# Patient Record
Sex: Male | Born: 1945 | Race: Black or African American | Hispanic: No | Smoking: Former smoker
Health system: Southern US, Community
[De-identification: ages and names within clinical notes are randomized; demographics above are authoritative.]

## PROBLEM LIST (undated history)

## (undated) DIAGNOSIS — I10 Essential (primary) hypertension: Secondary | ICD-10-CM

---

## 2016-10-01 ENCOUNTER — Other Ambulatory Visit (HOSPITAL_COMMUNITY): Payer: Self-pay | Admitting: Urology

## 2016-10-01 ENCOUNTER — Other Ambulatory Visit: Payer: Self-pay | Admitting: Urology

## 2016-10-01 DIAGNOSIS — D49511 Neoplasm of unspecified behavior of right kidney: Secondary | ICD-10-CM

## 2016-10-07 ENCOUNTER — Ambulatory Visit (HOSPITAL_COMMUNITY)
Admission: RE | Admit: 2016-10-07 | Discharge: 2016-10-07 | Disposition: A | Discharge status: Home / Self Care | Payer: Non-veteran care | Source: Ambulatory Visit | Attending: Urology | Admitting: Urology

## 2016-10-07 DIAGNOSIS — N2889 Other specified disorders of kidney and ureter: Secondary | ICD-10-CM | POA: Diagnosis not present

## 2016-10-07 DIAGNOSIS — D49511 Neoplasm of unspecified behavior of right kidney: Secondary | ICD-10-CM | POA: Insufficient documentation

## 2016-10-07 DIAGNOSIS — K7689 Other specified diseases of liver: Secondary | ICD-10-CM | POA: Diagnosis not present

## 2016-10-07 LAB — POCT I-STAT CREATININE: Creatinine, Ser: 0.9 mg/dL (ref 0.61–1.24)

## 2016-10-07 MED ORDER — GADOBENATE DIMEGLUMINE 529 MG/ML IV SOLN
15.0000 mL | Freq: Once | INTRAVENOUS | Status: AC | PRN
  Administered 2016-10-07: 15 mL via INTRAVENOUS

## 2016-11-01 NOTE — Patient Instructions (Addendum)
Derek Haynes  11/01/2016   Your procedure is scheduled on: 11-05-16  Report to [place]  elevators to 3rd floor to [place] at [place] AM.     Call this number if you have problems the morning of surgery [phone]    Remember: ONLY 1 PERSON MAY GO WITH YOU TO SHORT STAY TO GET  READY MORNING OF [place].  Do not eat food or drink liquids :After Midnight.        Take these medicines the morning of surgery with A SIP OF WATER: None                                You may not have any metal on your body including hair pins and              piercings  Do not wear jewelry, make-up, lotions, powders or perfumes, deodorant             Do not wear nail polish.  Do not shave  48 hours prior to surgery.              Men may shave face and neck.   Do not bring valuables to the hospital. [place].  Contacts, dentures or bridgework may not be worn into surgery.  Leave suitcase in the car. After surgery it may be brought to your room.                  Please read over the following fact sheets you were given: Follow bowel prep instruction as provided by your physician _____________________________________________________________________             [facility] - Preparing for Surgery Before surgery, you can play an important role.  Because skin is not sterile, your skin needs to be as free of germs as possible.  You can reduce the number of germs on your skin by washing with CHG (chlorahexidine gluconate) soap before surgery.  CHG is an antiseptic cleaner which kills germs and bonds with the skin to continue killing germs even after washing. Please DO NOT use if you have an allergy to CHG or antibacterial soaps.  If your skin becomes reddened/irritated stop using the CHG and inform your nurse when you arrive at Short Stay. Do not shave (including legs and underarms) for at least 48  hours prior to the first CHG shower.  You may shave your face/neck. Please follow these instructions carefully:  1.  Shower with CHG Soap the night before surgery and the  morning of Surgery.  2.  If you choose to wash your hair, wash your hair first as usual with your  normal  shampoo.  3.  After you shampoo, rinse your hair and body thoroughly to remove the  shampoo.                           4.  Use CHG as you would any other liquid soap.  You can apply chg directly  to the skin and wash                       Gently  with a scrungie or clean washcloth.  5.  Apply the CHG Soap to your body ONLY FROM THE NECK DOWN.   Do not use on face/ open                           Wound or open sores. Avoid contact with eyes, ears mouth and genitals (private parts).                       Wash face,  Genitals (private parts) with your normal soap.             6.  Wash thoroughly, paying special attention to the area where your surgery  will be performed.  7.  Thoroughly rinse your body with warm water from the neck down.  8.  DO NOT shower/wash with your normal soap after using and rinsing off  the CHG Soap.                9.  Pat yourself dry with a clean towel.            10.  Wear clean pajamas.            11.  Place clean sheets on your bed the night of your first shower and do not  sleep with pets. Day of Surgery : Do not apply any lotions/deodorants the morning of surgery.  Please wear clean clothes to the hospital/surgery center.  FAILURE TO FOLLOW THESE INSTRUCTIONS MAY RESULT IN THE CANCELLATION OF YOUR SURGERY PATIENT SIGNATURE_________________________________  NURSE SIGNATURE__________________________________  ________________________________________________________________________

## 2016-11-02 ENCOUNTER — Encounter (HOSPITAL_COMMUNITY): Payer: Self-pay

## 2016-11-02 ENCOUNTER — Encounter (HOSPITAL_COMMUNITY)
Admission: RE | Admit: 2016-11-02 | Discharge: 2016-11-02 | Disposition: A | Discharge status: Home / Self Care | Payer: Non-veteran care | Source: Ambulatory Visit | Attending: Urology | Admitting: Urology

## 2016-11-02 DIAGNOSIS — N2889 Other specified disorders of kidney and ureter: Secondary | ICD-10-CM | POA: Insufficient documentation

## 2016-11-02 DIAGNOSIS — Z01818 Encounter for other preprocedural examination: Secondary | ICD-10-CM

## 2016-11-02 HISTORY — DX: Essential (primary) hypertension: I10

## 2016-11-02 LAB — COMPREHENSIVE METABOLIC PANEL
ALK PHOS: 63 U/L (ref 38–126)
ALT: 13 U/L — ABNORMAL LOW (ref 17–63)
AST: 17 U/L (ref 15–41)
Albumin: 4.1 g/dL (ref 3.5–5.0)
Anion gap: 5 (ref 5–15)
BILIRUBIN TOTAL: 0.4 mg/dL (ref 0.3–1.2)
BUN: 12 mg/dL (ref 6–20)
CALCIUM: 9.2 mg/dL (ref 8.9–10.3)
CO2: 27 mmol/L (ref 22–32)
Chloride: 109 mmol/L (ref 101–111)
Creatinine, Ser: 0.97 mg/dL (ref 0.61–1.24)
GFR calc Af Amer: >60 mL/min (ref >60)
GFR calc non Af Amer: >60 mL/min (ref >60)
GLUCOSE: 101 mg/dL — AB (ref 65–99)
Potassium: 4.4 mmol/L (ref 3.5–5.1)
SODIUM: 141 mmol/L (ref 135–145)
TOTAL PROTEIN: 8.1 g/dL (ref 6.5–8.1)

## 2016-11-02 LAB — CBC
HEMATOCRIT: 38.6 % — AB (ref 39.0–52.0)
HEMOGLOBIN: 13 g/dL (ref 13.0–17.0)
MCH: 25 pg — ABNORMAL LOW (ref 26.0–34.0)
MCHC: 33.7 g/dL (ref 30.0–36.0)
MCV: 74.2 fL — ABNORMAL LOW (ref 78.0–100.0)
Platelets: 165 10*3/uL (ref 150–400)
RBC: 5.2 MIL/uL (ref 4.22–5.81)
RDW: 15.8 % — ABNORMAL HIGH (ref 11.5–15.5)
WBC: 6.4 10*3/uL (ref 4.0–10.5)

## 2016-11-02 LAB — ABO/RH: ABO/RH(D): B POS

## 2016-11-02 NOTE — Progress Notes (Signed)
Pt provided written copy of bowel prep instructions, but indicated that he did not have a prescription for the medications that was needed. Pt indicated that the prescription needed to be called into the [place].  Noted that date on envelope was stamped 10-04-16 from [place], and procedure scheduled for 11-05-16.. Advised pt to contact [facility] regarding his prescription. Pt indicated that he would go to the office and speak with someone.

## 2016-11-03 LAB — URINE CULTURE: CULTURE: NO GROWTH

## 2016-11-04 NOTE — Anesthesia Preprocedure Evaluation (Addendum)
Anesthesia Evaluation  Patient identified by MRN, date of birth, ID band Patient awake    Reviewed: Allergy & Precautions, NPO status , Patient's Chart, lab work & pertinent test results  Airway Mallampati: II  TM Distance: >3 FB Neck ROM: Full    Dental  (+) Missing, Poor Dentition   Pulmonary former smoker,    Pulmonary exam normal breath sounds clear to auscultation       Cardiovascular hypertension, Pt. on medications Normal cardiovascular exam Rhythm:Regular Rate:Normal     Neuro/Psych negative neurological ROS  negative psych ROS   GI/Hepatic negative GI ROS, Neg liver ROS,   Endo/Other  negative endocrine ROS  Renal/GU negative Renal ROS     Musculoskeletal negative musculoskeletal ROS (+)   Abdominal   Peds  Hematology negative hematology ROS (+)   Anesthesia Other Findings   Reproductive/Obstetrics                            Anesthesia Physical Anesthesia Plan  ASA: II  Anesthesia Plan: General   Post-op Pain Management:    Induction: Intravenous  Airway Management Planned: Oral ETT  Additional Equipment:   Intra-op Plan:   Post-operative Plan: Extubation in OR  Informed Consent: I have reviewed the patients History and Physical, chart, labs and discussed the procedure including the risks, benefits and alternatives for the proposed anesthesia with the patient or authorized representative who has indicated his/her understanding and acceptance.   Dental advisory given  Plan Discussed with: CRNA  Anesthesia Plan Comments:         Anesthesia Quick Evaluation

## 2016-11-05 ENCOUNTER — Inpatient Hospital Stay (HOSPITAL_COMMUNITY): Payer: Non-veteran care | Admitting: Anesthesiology

## 2016-11-05 ENCOUNTER — Inpatient Hospital Stay (HOSPITAL_COMMUNITY)
Admission: RE | Admit: 2016-11-05 | Discharge: 2016-11-06 | DRG: 658 | Disposition: A | Discharge status: Home / Self Care | Payer: Non-veteran care | Source: Ambulatory Visit | Attending: Urology | Admitting: Urology

## 2016-11-05 ENCOUNTER — Encounter (HOSPITAL_COMMUNITY): Payer: Self-pay | Admitting: *Deleted

## 2016-11-05 ENCOUNTER — Encounter (HOSPITAL_COMMUNITY)
Admission: RE | Disposition: A | Discharge status: Home / Self Care | Payer: Self-pay | Source: Ambulatory Visit | Attending: Urology

## 2016-11-05 DIAGNOSIS — C641 Malignant neoplasm of right kidney, except renal pelvis: Secondary | ICD-10-CM | POA: Diagnosis present

## 2016-11-05 DIAGNOSIS — Z95810 Presence of automatic (implantable) cardiac defibrillator: Secondary | ICD-10-CM | POA: Diagnosis not present

## 2016-11-05 DIAGNOSIS — Z87891 Personal history of nicotine dependence: Secondary | ICD-10-CM | POA: Diagnosis not present

## 2016-11-05 DIAGNOSIS — I10 Essential (primary) hypertension: Secondary | ICD-10-CM | POA: Diagnosis present

## 2016-11-05 DIAGNOSIS — N2889 Other specified disorders of kidney and ureter: Secondary | ICD-10-CM | POA: Diagnosis present

## 2016-11-05 HISTORY — PX: LAPAROSCOPIC NEPHRECTOMY: SHX1930

## 2016-11-05 LAB — BASIC METABOLIC PANEL
Anion gap: 11 (ref 5–15)
BUN: 9 mg/dL (ref 6–20)
CHLORIDE: 106 mmol/L (ref 101–111)
CO2: 23 mmol/L (ref 22–32)
Calcium: 8.1 mg/dL — ABNORMAL LOW (ref 8.9–10.3)
Creatinine, Ser: 1.19 mg/dL (ref 0.61–1.24)
GFR calc Af Amer: >60 mL/min (ref >60)
GFR calc non Af Amer: 59 mL/min — ABNORMAL LOW (ref >60)
Glucose, Bld: 187 mg/dL — ABNORMAL HIGH (ref 65–99)
POTASSIUM: 3 mmol/L — AB (ref 3.5–5.1)
SODIUM: 140 mmol/L (ref 135–145)

## 2016-11-05 LAB — HEMOGLOBIN AND HEMATOCRIT, BLOOD
HCT: 35.5 % — ABNORMAL LOW (ref 39.0–52.0)
Hemoglobin: 11.9 g/dL — ABNORMAL LOW (ref 13.0–17.0)

## 2016-11-05 LAB — TYPE AND SCREEN
ABO/RH(D): B POS
Antibody Screen: NEGATIVE

## 2016-11-05 SURGERY — NEPHRECTOMY, RADICAL, LAPAROSCOPIC, ADULT
Anesthesia: General | Laterality: Right

## 2016-11-05 MED ORDER — TRAMADOL HCL 50 MG PO TABS: 50.0000 mg | ORAL_TABLET | Freq: Four times a day (QID) | ORAL | 0 refills | Status: AC | PRN

## 2016-11-05 MED ORDER — BUPIVACAINE-EPINEPHRINE 0.5% -1:200000 IJ SOLN
INTRAMUSCULAR | Status: DC | PRN
  Administered 2016-11-05: 30 mL

## 2016-11-05 MED ORDER — ONDANSETRON HCL 4 MG/2ML IJ SOLN: 4.0000 mg | INTRAMUSCULAR | Status: DC | PRN

## 2016-11-05 MED ORDER — CEFAZOLIN SODIUM-DEXTROSE 1-4 GM/50ML-% IV SOLN
1.0000 g | Freq: Three times a day (TID) | INTRAVENOUS | Status: AC
  Administered 2016-11-05 – 2016-11-06 (×2): 1 g via INTRAVENOUS
  Filled 2016-11-05 (×2): qty 50

## 2016-11-05 MED ORDER — EPHEDRINE 5 MG/ML INJ
INTRAVENOUS | Status: AC
  Filled 2016-11-05: qty 10

## 2016-11-05 MED ORDER — STERILE WATER FOR IRRIGATION IR SOLN
Status: DC | PRN
  Administered 2016-11-05: 1000 mL

## 2016-11-05 MED ORDER — ROCURONIUM BROMIDE 10 MG/ML (PF) SYRINGE
PREFILLED_SYRINGE | INTRAVENOUS | Status: DC | PRN
  Administered 2016-11-05 (×2): 20 mg via INTRAVENOUS
  Administered 2016-11-05: 50 mg via INTRAVENOUS
  Administered 2016-11-05: 20 mg via INTRAVENOUS

## 2016-11-05 MED ORDER — LIDOCAINE 2% (20 MG/ML) 5 ML SYRINGE
INTRAMUSCULAR | Status: AC
  Filled 2016-11-05: qty 5

## 2016-11-05 MED ORDER — SUGAMMADEX SODIUM 200 MG/2ML IV SOLN
INTRAVENOUS | Status: DC | PRN
  Administered 2016-11-05: 200 mg via INTRAVENOUS

## 2016-11-05 MED ORDER — AMLODIPINE BESYLATE 10 MG PO TABS
10.0000 mg | ORAL_TABLET | Freq: Every day | ORAL | Status: DC
  Administered 2016-11-06: 10 mg via ORAL
  Filled 2016-11-05: qty 1

## 2016-11-05 MED ORDER — SODIUM CHLORIDE 0.9 % IJ SOLN
INTRAMUSCULAR | Status: AC
  Filled 2016-11-05: qty 50

## 2016-11-05 MED ORDER — ROCURONIUM BROMIDE 50 MG/5ML IV SOSY
PREFILLED_SYRINGE | INTRAVENOUS | Status: AC
  Filled 2016-11-05: qty 10

## 2016-11-05 MED ORDER — OXYCODONE HCL 5 MG PO TABS: 5.0000 mg | ORAL_TABLET | ORAL | Status: DC | PRN

## 2016-11-05 MED ORDER — ONDANSETRON HCL 4 MG/2ML IJ SOLN
INTRAMUSCULAR | Status: AC
  Filled 2016-11-05: qty 2

## 2016-11-05 MED ORDER — TRAMADOL HCL 50 MG PO TABS
100.0000 mg | ORAL_TABLET | Freq: Four times a day (QID) | ORAL | Status: DC | PRN
  Administered 2016-11-06: 100 mg via ORAL
  Filled 2016-11-05: qty 2

## 2016-11-05 MED ORDER — SUGAMMADEX SODIUM 200 MG/2ML IV SOLN
INTRAVENOUS | Status: AC
  Filled 2016-11-05: qty 2

## 2016-11-05 MED ORDER — ACETAMINOPHEN 10 MG/ML IV SOLN
1000.0000 mg | Freq: Four times a day (QID) | INTRAVENOUS | Status: AC
  Administered 2016-11-05 – 2016-11-06 (×4): 1000 mg via INTRAVENOUS
  Filled 2016-11-05 (×4): qty 100

## 2016-11-05 MED ORDER — KCL IN DEXTROSE-NACL 20-5-0.45 MEQ/L-%-% IV SOLN
INTRAVENOUS | Status: DC
  Administered 2016-11-05 – 2016-11-06 (×2): via INTRAVENOUS
  Filled 2016-11-05 (×3): qty 1000

## 2016-11-05 MED ORDER — DEXTROSE-NACL 5-0.45 % IV SOLN
INTRAVENOUS | Status: DC
  Administered 2016-11-05: 14:00:00 via INTRAVENOUS

## 2016-11-05 MED ORDER — FENTANYL CITRATE (PF) 250 MCG/5ML IJ SOLN
INTRAMUSCULAR | Status: DC | PRN
  Administered 2016-11-05 (×2): 50 ug via INTRAVENOUS
  Administered 2016-11-05: 100 ug via INTRAVENOUS
  Administered 2016-11-05: 50 ug via INTRAVENOUS

## 2016-11-05 MED ORDER — BUPIVACAINE LIPOSOME 1.3 % IJ SUSP
20.0000 mL | Freq: Once | INTRAMUSCULAR | Status: AC
  Administered 2016-11-05: 20 mL
  Filled 2016-11-05: qty 20

## 2016-11-05 MED ORDER — PROMETHAZINE HCL 25 MG/ML IJ SOLN: 6.2500 mg | INTRAMUSCULAR | Status: DC | PRN

## 2016-11-05 MED ORDER — LACTATED RINGERS IV SOLN
INTRAVENOUS | Status: DC | PRN
  Administered 2016-11-05 (×2): via INTRAVENOUS

## 2016-11-05 MED ORDER — PHENYLEPHRINE 40 MCG/ML (10ML) SYRINGE FOR IV PUSH (FOR BLOOD PRESSURE SUPPORT)
PREFILLED_SYRINGE | INTRAVENOUS | Status: DC | PRN
  Administered 2016-11-05 (×2): 80 ug via INTRAVENOUS

## 2016-11-05 MED ORDER — HYDROMORPHONE HCL 1 MG/ML IJ SOLN
INTRAMUSCULAR | Status: AC
  Filled 2016-11-05: qty 2

## 2016-11-05 MED ORDER — ONDANSETRON HCL 4 MG/2ML IJ SOLN
INTRAMUSCULAR | Status: DC | PRN
  Administered 2016-11-05: 4 mg via INTRAVENOUS

## 2016-11-05 MED ORDER — PHENYLEPHRINE 40 MCG/ML (10ML) SYRINGE FOR IV PUSH (FOR BLOOD PRESSURE SUPPORT)
PREFILLED_SYRINGE | INTRAVENOUS | Status: AC
  Filled 2016-11-05: qty 10

## 2016-11-05 MED ORDER — PROPOFOL 10 MG/ML IV BOLUS
INTRAVENOUS | Status: DC | PRN
  Administered 2016-11-05: 150 mg via INTRAVENOUS
  Administered 2016-11-05: 50 mg via INTRAVENOUS

## 2016-11-05 MED ORDER — MEPERIDINE HCL 50 MG/ML IJ SOLN: 6.2500 mg | INTRAMUSCULAR | Status: DC | PRN

## 2016-11-05 MED ORDER — DIPHENHYDRAMINE HCL 50 MG/ML IJ SOLN: 12.5000 mg | Freq: Four times a day (QID) | INTRAMUSCULAR | Status: DC | PRN

## 2016-11-05 MED ORDER — PROPOFOL 10 MG/ML IV BOLUS
INTRAVENOUS | Status: AC
  Filled 2016-11-05: qty 20

## 2016-11-05 MED ORDER — POTASSIUM CHLORIDE CRYS ER 20 MEQ PO TBCR
40.0000 meq | EXTENDED_RELEASE_TABLET | Freq: Two times a day (BID) | ORAL | Status: DC
  Administered 2016-11-05: 40 meq via ORAL
  Filled 2016-11-05: qty 2

## 2016-11-05 MED ORDER — HYDROMORPHONE HCL 1 MG/ML IJ SOLN
0.2500 mg | INTRAMUSCULAR | Status: DC | PRN
  Administered 2016-11-05 (×4): 0.25 mg via INTRAVENOUS

## 2016-11-05 MED ORDER — DEXAMETHASONE SODIUM PHOSPHATE 10 MG/ML IJ SOLN
INTRAMUSCULAR | Status: AC
  Filled 2016-11-05: qty 1

## 2016-11-05 MED ORDER — CEFAZOLIN SODIUM-DEXTROSE 2-4 GM/100ML-% IV SOLN
2.0000 g | INTRAVENOUS | Status: AC
  Administered 2016-11-05: 2 g via INTRAVENOUS

## 2016-11-05 MED ORDER — SENNA 8.6 MG PO TABS
1.0000 | ORAL_TABLET | Freq: Two times a day (BID) | ORAL | Status: DC
  Administered 2016-11-05 – 2016-11-06 (×2): 8.6 mg via ORAL
  Filled 2016-11-05 (×2): qty 1

## 2016-11-05 MED ORDER — CEFAZOLIN SODIUM-DEXTROSE 2-4 GM/100ML-% IV SOLN
INTRAVENOUS | Status: AC
  Filled 2016-11-05: qty 100

## 2016-11-05 MED ORDER — HYDROMORPHONE HCL 1 MG/ML IJ SOLN: 0.5000 mg | INTRAMUSCULAR | Status: DC | PRN

## 2016-11-05 MED ORDER — DEXTROSE 5 % IV SOLN
INTRAVENOUS | Status: DC | PRN
  Administered 2016-11-05: 40 ug/min via INTRAVENOUS

## 2016-11-05 MED ORDER — DEXAMETHASONE SODIUM PHOSPHATE 10 MG/ML IJ SOLN
INTRAMUSCULAR | Status: DC | PRN
  Administered 2016-11-05: 10 mg via INTRAVENOUS

## 2016-11-05 MED ORDER — BUPIVACAINE-EPINEPHRINE (PF) 0.5% -1:200000 IJ SOLN
INTRAMUSCULAR | Status: AC
  Filled 2016-11-05: qty 30

## 2016-11-05 MED ORDER — SODIUM CHLORIDE 0.9 % IJ SOLN
INTRAMUSCULAR | Status: AC
  Filled 2016-11-05: qty 20

## 2016-11-05 MED ORDER — POTASSIUM CHLORIDE 2 MEQ/ML IV SOLN: INTRAVENOUS | Status: DC

## 2016-11-05 MED ORDER — LIDOCAINE 2% (20 MG/ML) 5 ML SYRINGE
INTRAMUSCULAR | Status: DC | PRN
  Administered 2016-11-05: 100 mg via INTRAVENOUS

## 2016-11-05 MED ORDER — SODIUM CHLORIDE 0.9 % IJ SOLN
INTRAMUSCULAR | Status: DC | PRN
  Administered 2016-11-05: 63 mL

## 2016-11-05 MED ORDER — DIPHENHYDRAMINE HCL 12.5 MG/5ML PO ELIX: 12.5000 mg | ORAL_SOLUTION | Freq: Four times a day (QID) | ORAL | Status: DC | PRN

## 2016-11-05 MED ORDER — FENTANYL CITRATE (PF) 250 MCG/5ML IJ SOLN
INTRAMUSCULAR | Status: AC
Start: 2016-11-05 — End: 2016-11-05
  Filled 2016-11-05: qty 5

## 2016-11-05 MED ORDER — LACTATED RINGERS IV SOLN
INTRAVENOUS | Status: DC | PRN
  Administered 2016-11-05: 09:00:00 via INTRAVENOUS

## 2016-11-05 MED ORDER — ROCURONIUM BROMIDE 50 MG/5ML IV SOSY
PREFILLED_SYRINGE | INTRAVENOUS | Status: AC
  Filled 2016-11-05: qty 5

## 2016-11-05 MED ORDER — PHENYLEPHRINE HCL 10 MG/ML IJ SOLN
INTRAMUSCULAR | Status: AC
  Filled 2016-11-05: qty 1

## 2016-11-05 MED ORDER — DOCUSATE SODIUM 100 MG PO CAPS
100.0000 mg | ORAL_CAPSULE | Freq: Two times a day (BID) | ORAL | Status: DC
  Administered 2016-11-05 – 2016-11-06 (×2): 100 mg via ORAL
  Filled 2016-11-05 (×2): qty 1

## 2016-11-05 MED ORDER — EPHEDRINE SULFATE-NACL 50-0.9 MG/10ML-% IV SOSY
PREFILLED_SYRINGE | INTRAVENOUS | Status: DC | PRN
  Administered 2016-11-05: 15 mg via INTRAVENOUS
  Administered 2016-11-05: 5 mg via INTRAVENOUS
  Administered 2016-11-05: 15 mg via INTRAVENOUS
  Administered 2016-11-05: 5 mg via INTRAVENOUS
  Administered 2016-11-05: 10 mg via INTRAVENOUS

## 2016-11-05 SURGICAL SUPPLY — 54 items
APPLICATOR ARISTA FLEXITIP XL (MISCELLANEOUS) IMPLANT
APPLICATOR SURGIFLO ENDO (HEMOSTASIS) IMPLANT
APPLIER CLIP ROT 10 11.4 M/L (STAPLE)
BAG LAPAROSCOPIC 12 15 PORT 16 (BASKET) ×1 IMPLANT
BAG RETRIEVAL 12/15 (BASKET) ×2
BAG RETRIEVAL 12/15MM (BASKET) ×1
BAG ZIPLOCK 12X15 (MISCELLANEOUS) ×3 IMPLANT
BLADE EXTENDED COATED 6.5IN (ELECTRODE) IMPLANT
BLADE SURG SZ10 CARB STEEL (BLADE) IMPLANT
CHLORAPREP W/TINT 26ML (MISCELLANEOUS) ×3 IMPLANT
CLIP APPLIE ROT 10 11.4 M/L (STAPLE) IMPLANT
CLIP LIGATING HEM O LOK PURPLE (MISCELLANEOUS) ×3 IMPLANT
CLIP LIGATING HEMO LOK XL GOLD (MISCELLANEOUS) IMPLANT
CLIP LIGATING HEMO O LOK GREEN (MISCELLANEOUS) ×3 IMPLANT
CUTTER FLEX LINEAR 45M (STAPLE) ×3 IMPLANT
DECANTER SPIKE VIAL GLASS SM (MISCELLANEOUS) ×3 IMPLANT
DERMABOND ADVANCED (GAUZE/BANDAGES/DRESSINGS) ×2
DERMABOND ADVANCED .7 DNX12 (GAUZE/BANDAGES/DRESSINGS) ×1 IMPLANT
DRAPE INCISE IOBAN 66X45 STRL (DRAPES) ×3 IMPLANT
DRAPE WARM FLUID 44X44 (DRAPE) IMPLANT
ELECT PENCIL ROCKER SW 15FT (MISCELLANEOUS) ×3 IMPLANT
ELECT REM PT RETURN 15FT ADLT (MISCELLANEOUS) ×3 IMPLANT
GLOVE BIOGEL M STRL SZ7.5 (GLOVE) ×3 IMPLANT
GOWN STRL REUS W/TWL LRG LVL3 (GOWN DISPOSABLE) ×6 IMPLANT
HEMOSTAT ARISTA ABSORB 3G PWDR (MISCELLANEOUS) IMPLANT
HEMOSTAT SURGICEL 4X8 (HEMOSTASIS) ×3 IMPLANT
IRRIG SUCT STRYKERFLOW 2 WTIP (MISCELLANEOUS) ×3
IRRIGATION SUCT STRKRFLW 2 WTP (MISCELLANEOUS) ×1 IMPLANT
KIT BASIN OR (CUSTOM PROCEDURE TRAY) ×3 IMPLANT
MANIFOLD NEPTUNE II (INSTRUMENTS) ×3 IMPLANT
NEEDLE SPNL 22GX7 QUINCKE BK (NEEDLE) ×3 IMPLANT
PAD POSITIONING PINK XL (MISCELLANEOUS) IMPLANT
POSITIONER SURGICAL ARM (MISCELLANEOUS) ×6 IMPLANT
RELOAD 45 VASCULAR/THIN (ENDOMECHANICALS) ×12 IMPLANT
RELOAD STAPLE TA45 3.5 REG BLU (ENDOMECHANICALS) IMPLANT
SCISSORS LAP 5X35 DISP (ENDOMECHANICALS) IMPLANT
SHEARS HARMONIC ACE PLUS 36CM (ENDOMECHANICALS) ×3 IMPLANT
SLEEVE XCEL OPT CAN 5 100 (ENDOMECHANICALS) ×6 IMPLANT
SPONGE LAP 4X18 X RAY DECT (DISPOSABLE) IMPLANT
SUT MNCRL AB 4-0 PS2 18 (SUTURE) ×6 IMPLANT
SUT PDS AB 0 CT1 36 (SUTURE) ×6 IMPLANT
SUT VIC AB 2-0 CT1 27 (SUTURE) ×2
SUT VIC AB 2-0 CT1 27XBRD (SUTURE) ×1 IMPLANT
SUT VICRYL 0 UR6 27IN ABS (SUTURE) ×3 IMPLANT
TAPE CLOTH 4X10 WHT NS (GAUZE/BANDAGES/DRESSINGS) IMPLANT
TOWEL OR 17X26 10 PK STRL BLUE (TOWEL DISPOSABLE) ×3 IMPLANT
TOWEL OR NON WOVEN STRL DISP B (DISPOSABLE) ×3 IMPLANT
TRAY FOLEY CATH 16FR SILVER (SET/KITS/TRAYS/PACK) ×3 IMPLANT
TRAY LAPAROSCOPIC (CUSTOM PROCEDURE TRAY) ×3 IMPLANT
TROCAR BLADELESS OPT 5 100 (ENDOMECHANICALS) ×3 IMPLANT
TROCAR UNIVERSAL OPT 12M 100M (ENDOMECHANICALS) ×3 IMPLANT
TROCAR XCEL 12X100 BLDLESS (ENDOMECHANICALS) ×3 IMPLANT
TUBING INSUF HEATED (TUBING) ×3 IMPLANT
TUBING INSUFFLATION 10FT LAP (TUBING) ×3 IMPLANT

## 2016-11-05 NOTE — Anesthesia Postprocedure Evaluation (Signed)
Anesthesia Post Note  Patient: Derek Haynes  Procedure(s) Performed: Procedure(s) (LRB): RIGHT LAPAROSCOPIC RADICAL  NEPHRECTOMY (Right)  Patient location during evaluation: PACU Anesthesia Type: General Level of consciousness: sedated and patient cooperative Pain management: pain level controlled Vital Signs Assessment: post-procedure vital signs reviewed and stable Respiratory status: spontaneous breathing Cardiovascular status: stable Anesthetic complications: no       Last Vitals:  Vitals:   11/05/16 1300 11/05/16 1315  BP:  126/63  Pulse: 76 72  Resp: 16 16  Temp: 36.4 C 36.4 C    Last Pain:  Vitals:   11/05/16 1324  TempSrc:   PainSc: [place]

## 2016-11-05 NOTE — Discharge Instructions (Signed)

## 2016-11-05 NOTE — Anesthesia Procedure Notes (Signed)
Procedure Name: Intubation Date/Time: 11/05/2016 7:49 AM Performed by: Talbot Grumbling Pre-anesthesia Checklist: Patient identified, Emergency Drugs available, Suction available and Patient being monitored Patient Re-evaluated:Patient Re-evaluated prior to inductionOxygen Delivery Method: Circle system utilized Preoxygenation: Pre-oxygenation with 100% oxygen Intubation Type: IV induction Ventilation: Mask ventilation without difficulty and Oral airway inserted - appropriate to patient size Laryngoscope Size: Glidescope (attempt to visualize with Miller 2 and Mac 4 without success. Glidescope LoPro 3 used) Grade View: Grade II Tube type: Oral Tube size: 7.5 mm Number of attempts: 1 Airway Equipment and Method: Stylet and Video-laryngoscopy Placement Confirmation: ETT inserted through vocal cords under direct vision,  positive ETCO2 and breath sounds checked- equal and bilateral (upper gum with small amount blood noted, pressure held-no further bleeding) Secured at: 23 cm Tube secured with: Tape

## 2016-11-05 NOTE — Transfer of Care (Signed)
Immediate Anesthesia Transfer of Care Note  Patient: Derek Haynes  Procedure(s) Performed: Procedure(s): RIGHT LAPAROSCOPIC RADICAL  NEPHRECTOMY (Right)  Patient Location: PACU  Anesthesia Type2:General  Level of Consciousness:  sedated, patient cooperative and responds to stimulation  Airway & Oxygen Therapy:Patient Spontanous Breathing and Patient connected to face mask oxgen  Post-op Assessment:  Report given to PACU RN and Post -op Vital signs reviewed and stable  Post vital signs:  Reviewed and stable  Last Vitals:  Vitals:   11/05/16 0540  BP: (!) 164/85  Pulse: 74  Resp: 18  Temp: 36.1 C    Complications: No apparent anesthesia complications
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
## 2016-11-05 NOTE — Discharge Summary (Signed)
Physician Discharge Summary  Patient ID: Derek Haynes MRN: [id number] DOB/AGE: 73-Jul-1947 73 y.o.  Admit date: 11/05/2016 Discharge date: 11/07/2106  Admission Diagnoses: Right renal mass  Discharge Diagnoses: Same  Discharged Condition: good  Hospital Course:   Derek Haynes is a 71 y.o. male who is s/p right laparoscopic radical nephrectomy on 11/05/16 with Dr. Louis Meckel & Dr. Junious Silk for right renal mass(es).  The patient tolerated the procedure well, was extubated in the OR and taken to the recovery unit for routine postoperative care. They were then transferred to the floor. By POD1 he had met the usual goals for discharge including ambulating at a preoperative capacity, having pain controlled with PO PRN medications, and tolerating a regular diet. His Foley catheter was removed on POD1 and he passed his TOV. Path pending at discharge. Cr at discharge 1.4.     Consults: None  Significant Diagnostic Studies: labs: as per above  Treatments: surgery: as noted above  Discharge Exam: Blood pressure (!) 164/85, pulse 74, temperature 97.6 F (36.4 C), temperature source Oral, resp. rate 18, height '5\' 6"'  (1.676 m), weight 79.5 kg (175 lb 6 oz), SpO2 100 %.   General:  well-developed and well-nourished male in NAD, lying in bed, alert & oriented, pleasant HEENT: [place]/AT, EOMI, sclera anicteric, hearing grossly intact, no nasal discharge, MMM Respiratory: nonlabored respirations, satting well on RA, symmetrical chest rise Cardiovascular: pulse regular rate & rhythm Abdominal: soft, mildly & appropriately TTP, nondistended, surgical incisions c/d/i without signs of exudate/erythema GU: NO CVAT Extremities: warm, well-perfused, no c/c/e Neuro: no focal deficits   Disposition: Final discharge disposition not confirmed     Signed: Phebe Colla MD 11/06/16

## 2016-11-05 NOTE — Op Note (Signed)
Preoperative diagnosis:  1. Right renal mass   Postoperative diagnosis:  1. same   Procedure: 1. Laparoscopic right radical nephrectomy  Surgeon: Ardis Hughs, MD First Assistant: Dr. Festus Aloe, MD  Anesthesia: General  Complications: None  Intraoperative findings: large bulky anterior/upper pole mass.  EBL: 150cc  Specimens:  Right kidney and proximal ureter  Indication: Derek Haynes is a 71 y.o. patient with large right renal mass.  After reviewing the management options for treatment, he elected to proceed with the above surgical procedure(s). We have discussed the potential benefits and risks of the procedure, side effects of the proposed treatment, the likelihood of the patient achieving the goals of the procedure, and any potential problems that might occur during the procedure or recuperation. Informed consent has been obtained.  Description of procedure:  An assistant was required for this surgical procedure.  The duties of the assistant included but were not limited to suctioning, passing suture, camera manipulation, retraction. This procedure would not be able to be performed without an Environmental consultant.  A site was selected lateral to the umbilicus for placement of the camera port. This was placed using a standard open Hassan technique which allowed entry into the peritoneal cavity under direct vision and without difficulty. A 12 mm Hassan cannula was placed and a pneumoperitoneum established. The camera was then used to inspect the abdomen and there was no evidence of any intra-abdominal injuries or other abnormalities. The remaining abdominal ports were then placed under visual guidance.  A second 12 mm port was placed in the right lower quadrant approximately 8 cm away from the camera. A 5 mm port was placed in the right upper quadrant again 8 cm away from the camera. A second 5 mm port was placed just inferior to the xiphoid process in the midline, this was used as a  liver retractor.  An additional 5 mm port was then placed in the right lower quadrant at the anterior axillary line lateral to the 12 mm port. All ports were placed under direct vision without difficulty.   The white line of Toldt was incised allowing the colon to be mobilized medially and the plane between the mesocolon and the anterior layer of Gerota's fascia to be developed and the kidney exposed. The ureter and gonadal vein were identified inferiorly and the ureter was lifted anteriorly off the psoas muscle. Dissection proceeded superiorly along the gonadal vein until the renal vein was identified. The renal hilum was then carefully isolated with a combination of blunt and sharp dissectiong allowing the renal arterial and venous structures to be separated and isolated.   The renal artery was isolated and ligated using  a 45 mm Flex ETS stapler.  The renal vein was then isolated and also ligated and divided with a 45 mm Flex ETS stapler.   The dissection continued around the anteriomedial edge of the kidney, the adrenal gland was included in the specimen.  The hepatorenal ligaments were divided.. The lateral and posterior attachements to the kidney were then divided. Once the kidney was free from its attachments  80cc of 0.25% rupivocaine was then injected into the right anterior axillary line b/w the iliac crest and the twelfth rib under laparoscopic guidance. The layer between the tranversus abdominus and the internal oblique was targeted.   The kidney/ureter specimen was then placed into a 12 mm Endocatch II retrieval bag. The renal hilum, liver, adrenal bed and gonadal vein areas were each inspected and hemostasis was ensured with the  pneomperitoneal pressures lowered. Surgicel was then placed over the hilum.  The camera was then brought to the 12 mm port laterally and the camera port was removed and the fascia closed using a [place] needle with a 0 Vicryl.  All the ports were then removed  under visual guidance. The lateral 12 mm port and 5 mm port were then connected sharply with a 15 blade. Then opened this incision down to the external oblique fascia. We then spread the muscle fibers down to the internal oblique fascia which we then opened with cautery. These were then spread in all muscle spared and the posterior peritoneum was opened. The rectus muscle was pulled medially. The specimen was then removed through these incision. The internal oblique fascia was then closed with a 0 Vicryl in a running fashion. The external oblique fascia was then closed with a 0 PDS in a running fashion.  All incisions were injected with local anesthetic and reapproximated at the skin with 4-0 monocryl sutures. Dermabond was applied to the skin. The patient tolerated the procedure well and without complications and was transferred to the recovery unit in satisfactory condition.   Ardis Hughs, M.D.

## 2016-11-06 LAB — GLUCOSE, CAPILLARY: Glucose-Capillary: 146 mg/dL — ABNORMAL HIGH (ref 65–99)

## 2016-11-06 LAB — BASIC METABOLIC PANEL
Anion gap: 7 (ref 5–15)
BUN: 11 mg/dL (ref 6–20)
CALCIUM: 8.4 mg/dL — AB (ref 8.9–10.3)
CHLORIDE: 106 mmol/L (ref 101–111)
CO2: 24 mmol/L (ref 22–32)
CREATININE: 1.4 mg/dL — AB (ref 0.61–1.24)
GFR calc Af Amer: 56 mL/min — ABNORMAL LOW (ref >60)
GFR calc non Af Amer: 49 mL/min — ABNORMAL LOW (ref >60)
GLUCOSE: 139 mg/dL — AB (ref 65–99)
Potassium: 4.5 mmol/L (ref 3.5–5.1)
Sodium: 137 mmol/L (ref 135–145)

## 2016-11-06 LAB — HEMOGLOBIN AND HEMATOCRIT, BLOOD
HCT: 36.1 % — ABNORMAL LOW (ref 39.0–52.0)
Hemoglobin: 12 g/dL — ABNORMAL LOW (ref 13.0–17.0)

## 2016-11-06 MED ORDER — SODIUM CHLORIDE 0.9% FLUSH: 3.0000 mL | INTRAVENOUS | Status: DC | PRN

## 2016-11-06 MED ORDER — SODIUM CHLORIDE 0.9 % IV SOLN: 250.0000 mL | INTRAVENOUS | Status: DC | PRN

## 2016-11-06 MED ORDER — SODIUM CHLORIDE 0.9% FLUSH: 3.0000 mL | Freq: Two times a day (BID) | INTRAVENOUS | Status: DC

## 2016-11-06 NOTE — Progress Notes (Signed)
Patient D/C paperwork and education completed. Upon patient getting up to get dressed felt weak and got hot and sweaty. Patient assisted to chair, CBG 146, VSS. Patient has not eaten in over 48 hours, pt states, "I think I just need something of substance to eat." Offered to call MD on call and ask for D/C to be cancelled but patient and family are adamant that they would like to still go home. Food given to patient and patient will stay and reassess leaving. Reported off to night shift RN the plan. Night shift RN will continue to monitor.

## 2016-11-06 NOTE — Progress Notes (Signed)
After eating, patient felt better and still felt ok to d/c home. Family with patient, vital signs stable. Pt no longer feeling weak/hot/sweaty. Nurse tech assisted patient into wheelchair to d/c.

## 2017-05-20 ENCOUNTER — Other Ambulatory Visit: Payer: Self-pay | Admitting: Urology

## 2017-05-20 ENCOUNTER — Ambulatory Visit (HOSPITAL_COMMUNITY)
Admission: RE | Admit: 2017-05-20 | Discharge: 2017-05-20 | Disposition: A | Discharge status: Home / Self Care | Payer: No Typology Code available for payment source | Source: Ambulatory Visit | Attending: Urology | Admitting: Urology

## 2017-05-20 DIAGNOSIS — C642 Malignant neoplasm of left kidney, except renal pelvis: Secondary | ICD-10-CM | POA: Insufficient documentation

## 2018-05-04 IMAGING — DX DG CHEST 2V
2 series · 2 of 2 positions shown · non-contrast
Comparison: None.

CLINICAL DATA: History of right renal cell carcinoma with
resection, followup exam

EXAM:
CHEST  2 VIEW

[chest pa]
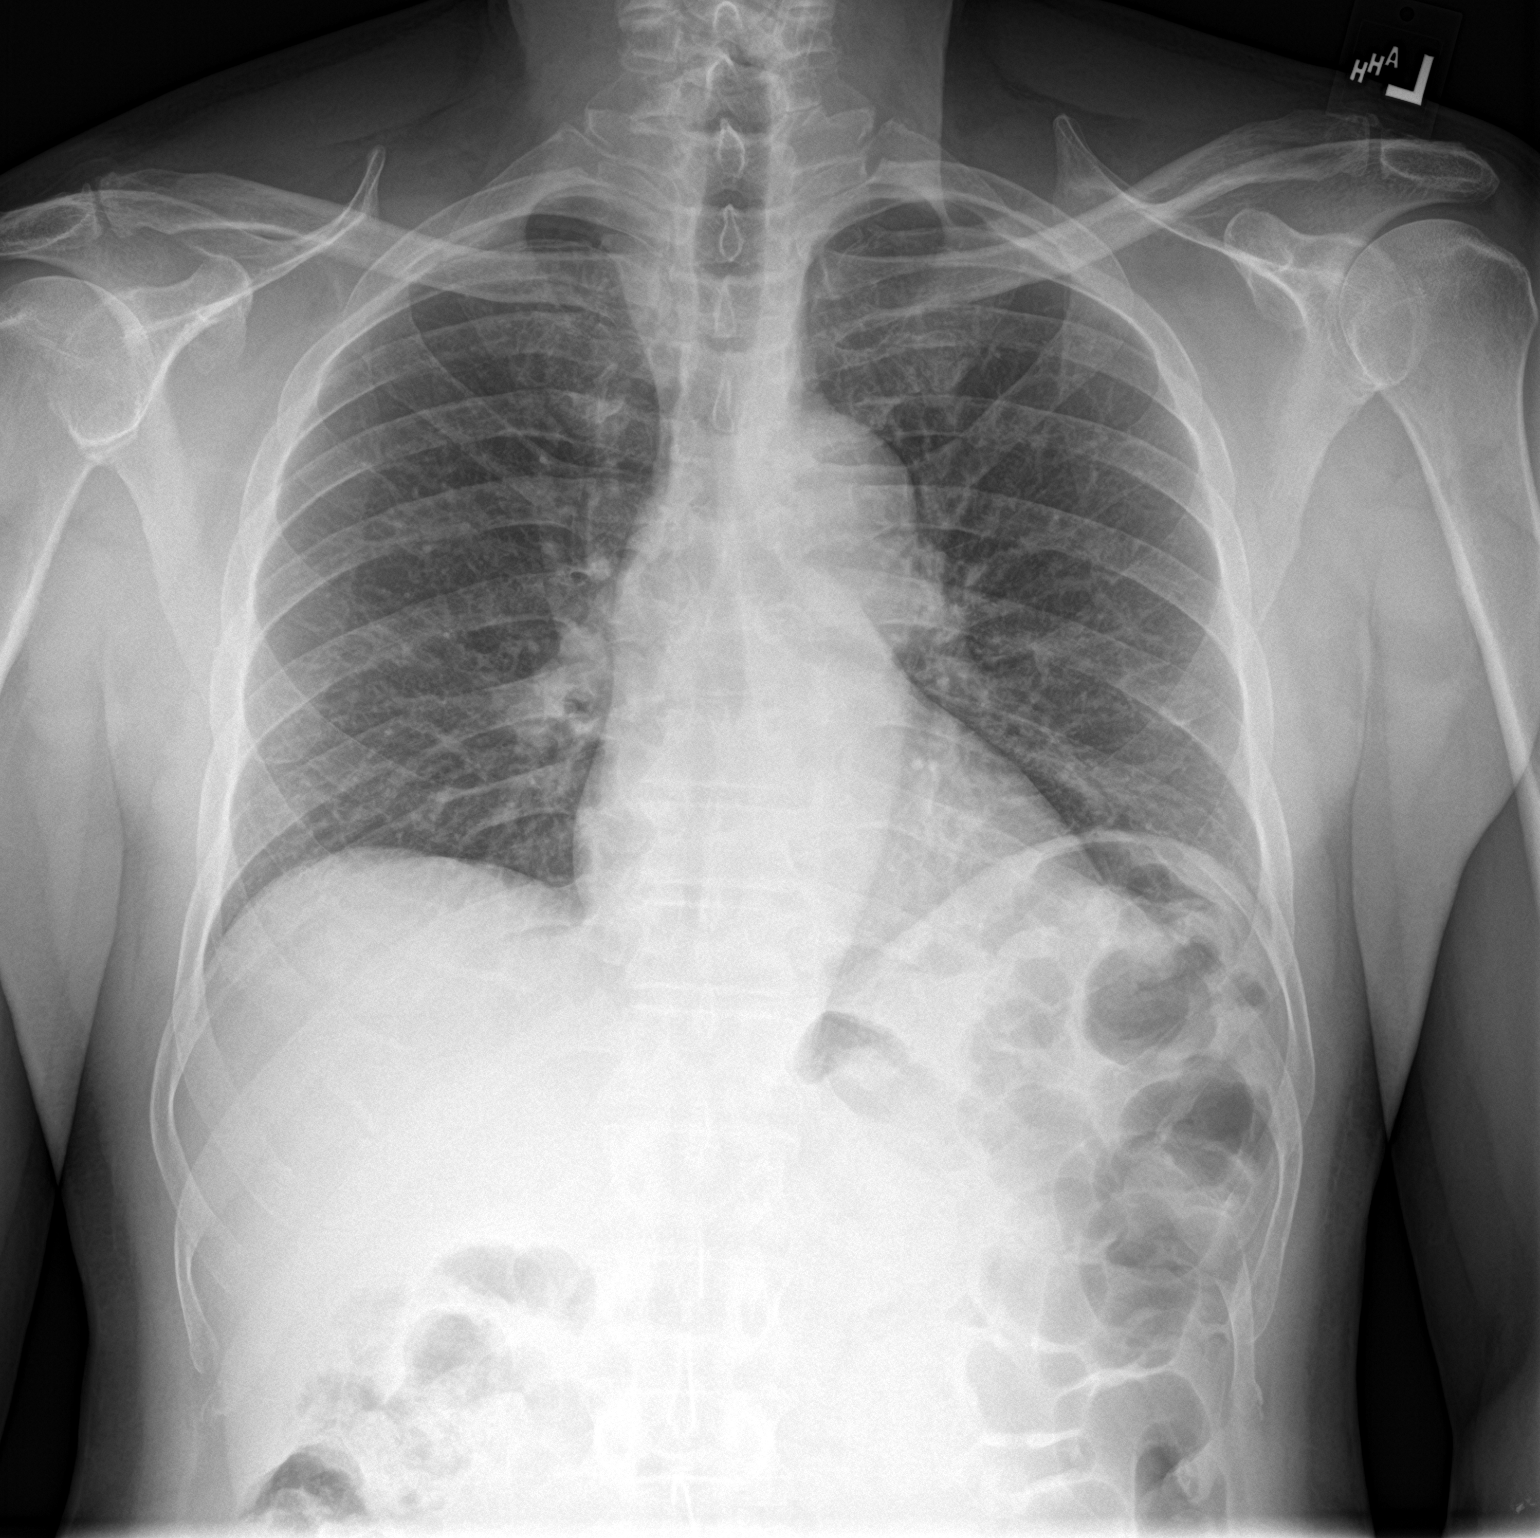

[chest lat]
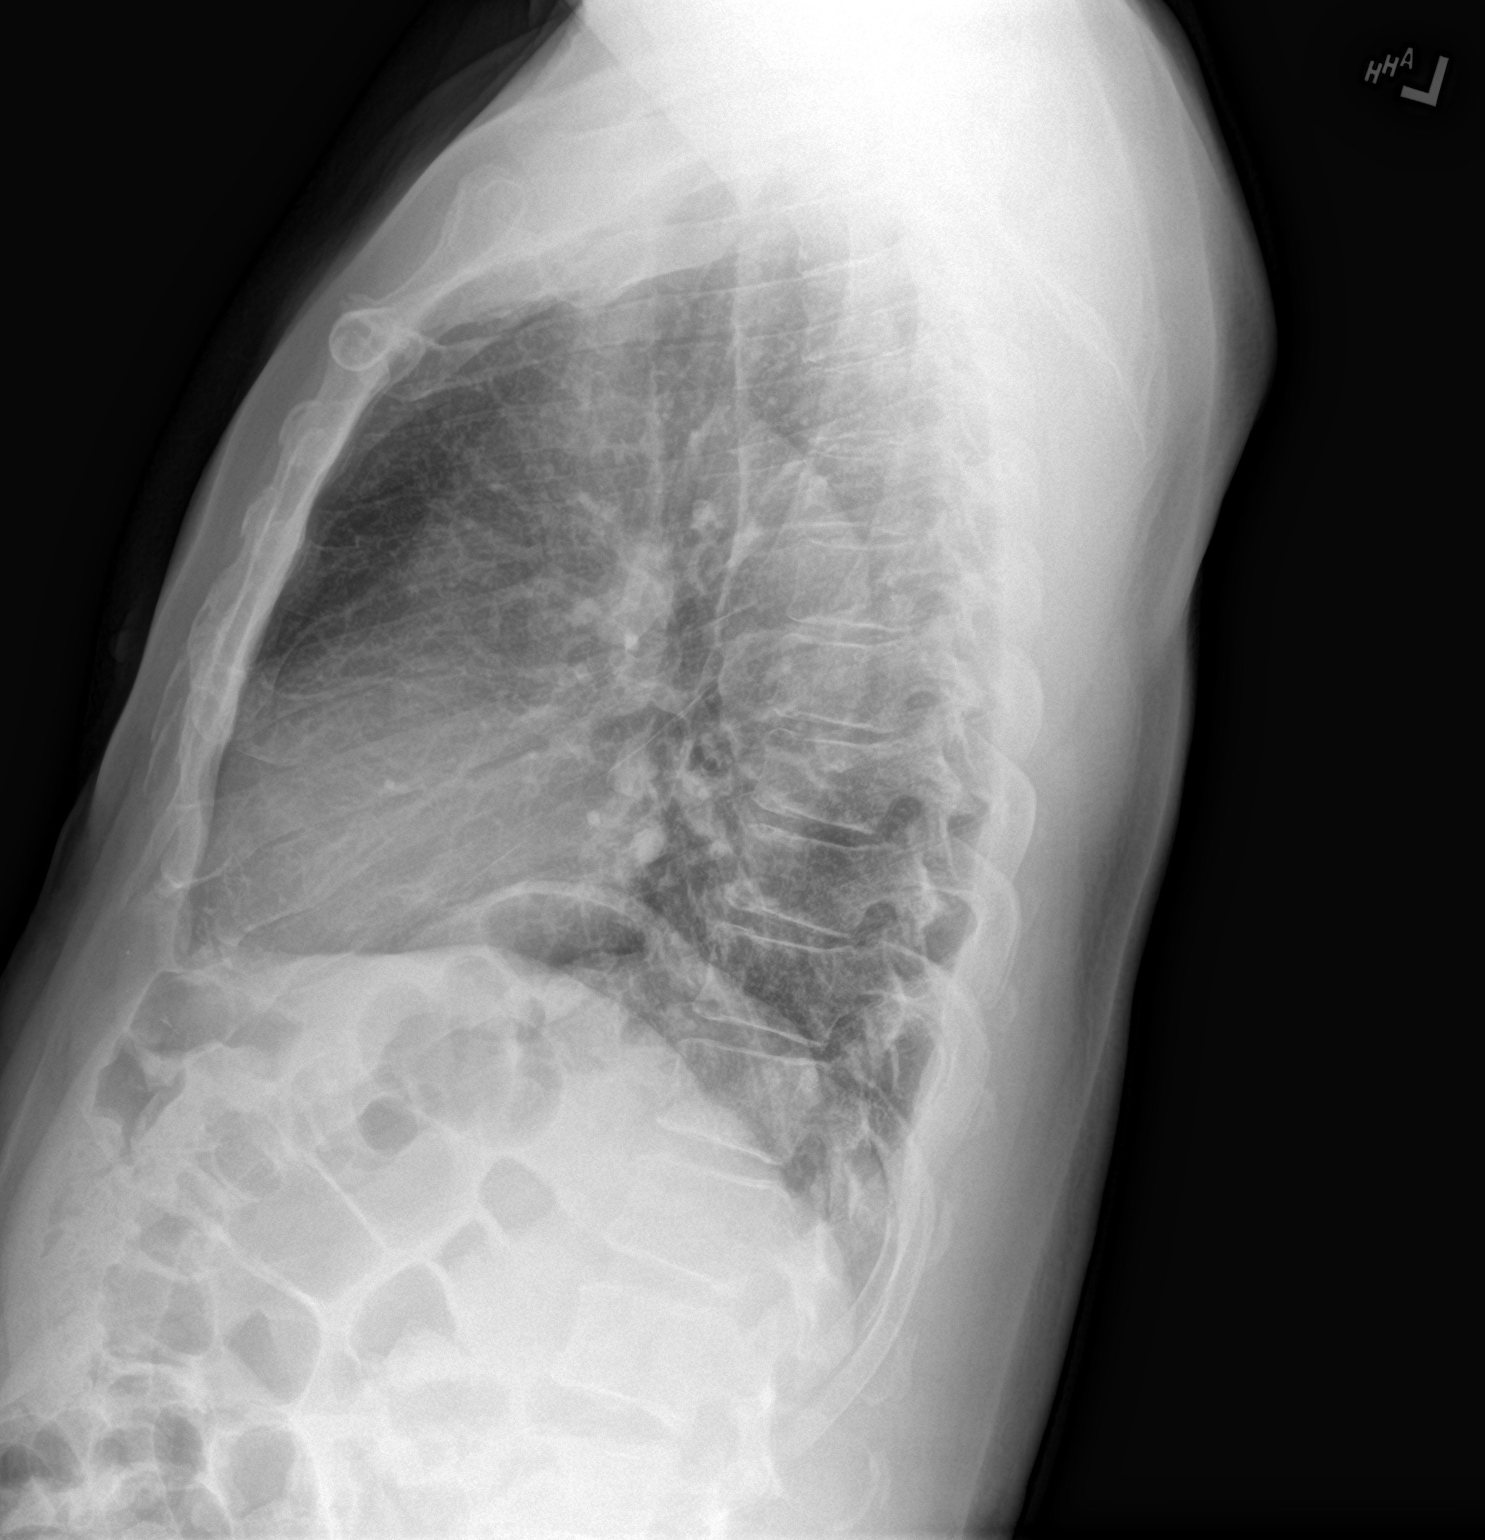

[2 of 2 positions shown; findings below may reference images not displayed]

FINDINGS: The heart size and mediastinal contours are within normal limits.
Both lungs are clear. The visualized skeletal structures are
unremarkable.
IMPRESSION: No active cardiopulmonary disease.

## 2018-06-05 ENCOUNTER — Ambulatory Visit (HOSPITAL_COMMUNITY)
Admission: RE | Admit: 2018-06-05 | Discharge: 2018-06-05 | Disposition: A | Discharge status: Home / Self Care | Payer: Non-veteran care | Source: Ambulatory Visit | Attending: Urology | Admitting: Urology

## 2018-06-05 ENCOUNTER — Other Ambulatory Visit: Payer: Self-pay | Admitting: Urology

## 2018-06-05 DIAGNOSIS — C642 Malignant neoplasm of left kidney, except renal pelvis: Secondary | ICD-10-CM

## 2019-01-17 ENCOUNTER — Other Ambulatory Visit (HOSPITAL_COMMUNITY): Payer: Self-pay | Admitting: Urology

## 2019-01-17 ENCOUNTER — Ambulatory Visit (HOSPITAL_COMMUNITY)
Admission: RE | Admit: 2019-01-17 | Discharge: 2019-01-17 | Disposition: A | Discharge status: Home / Self Care | Payer: No Typology Code available for payment source | Source: Ambulatory Visit | Attending: Urology | Admitting: Urology

## 2019-01-17 ENCOUNTER — Other Ambulatory Visit: Payer: Self-pay

## 2019-01-17 DIAGNOSIS — C642 Malignant neoplasm of left kidney, except renal pelvis: Secondary | ICD-10-CM

## 2020-01-01 IMAGING — DX CHEST - 2 VIEW
2 series · 2 of 2 positions shown · non-contrast
Comparison: May 20, 2017

CLINICAL DATA: Renal cell carcinoma.

EXAM:
CHEST - 2 VIEW

[chest pa]
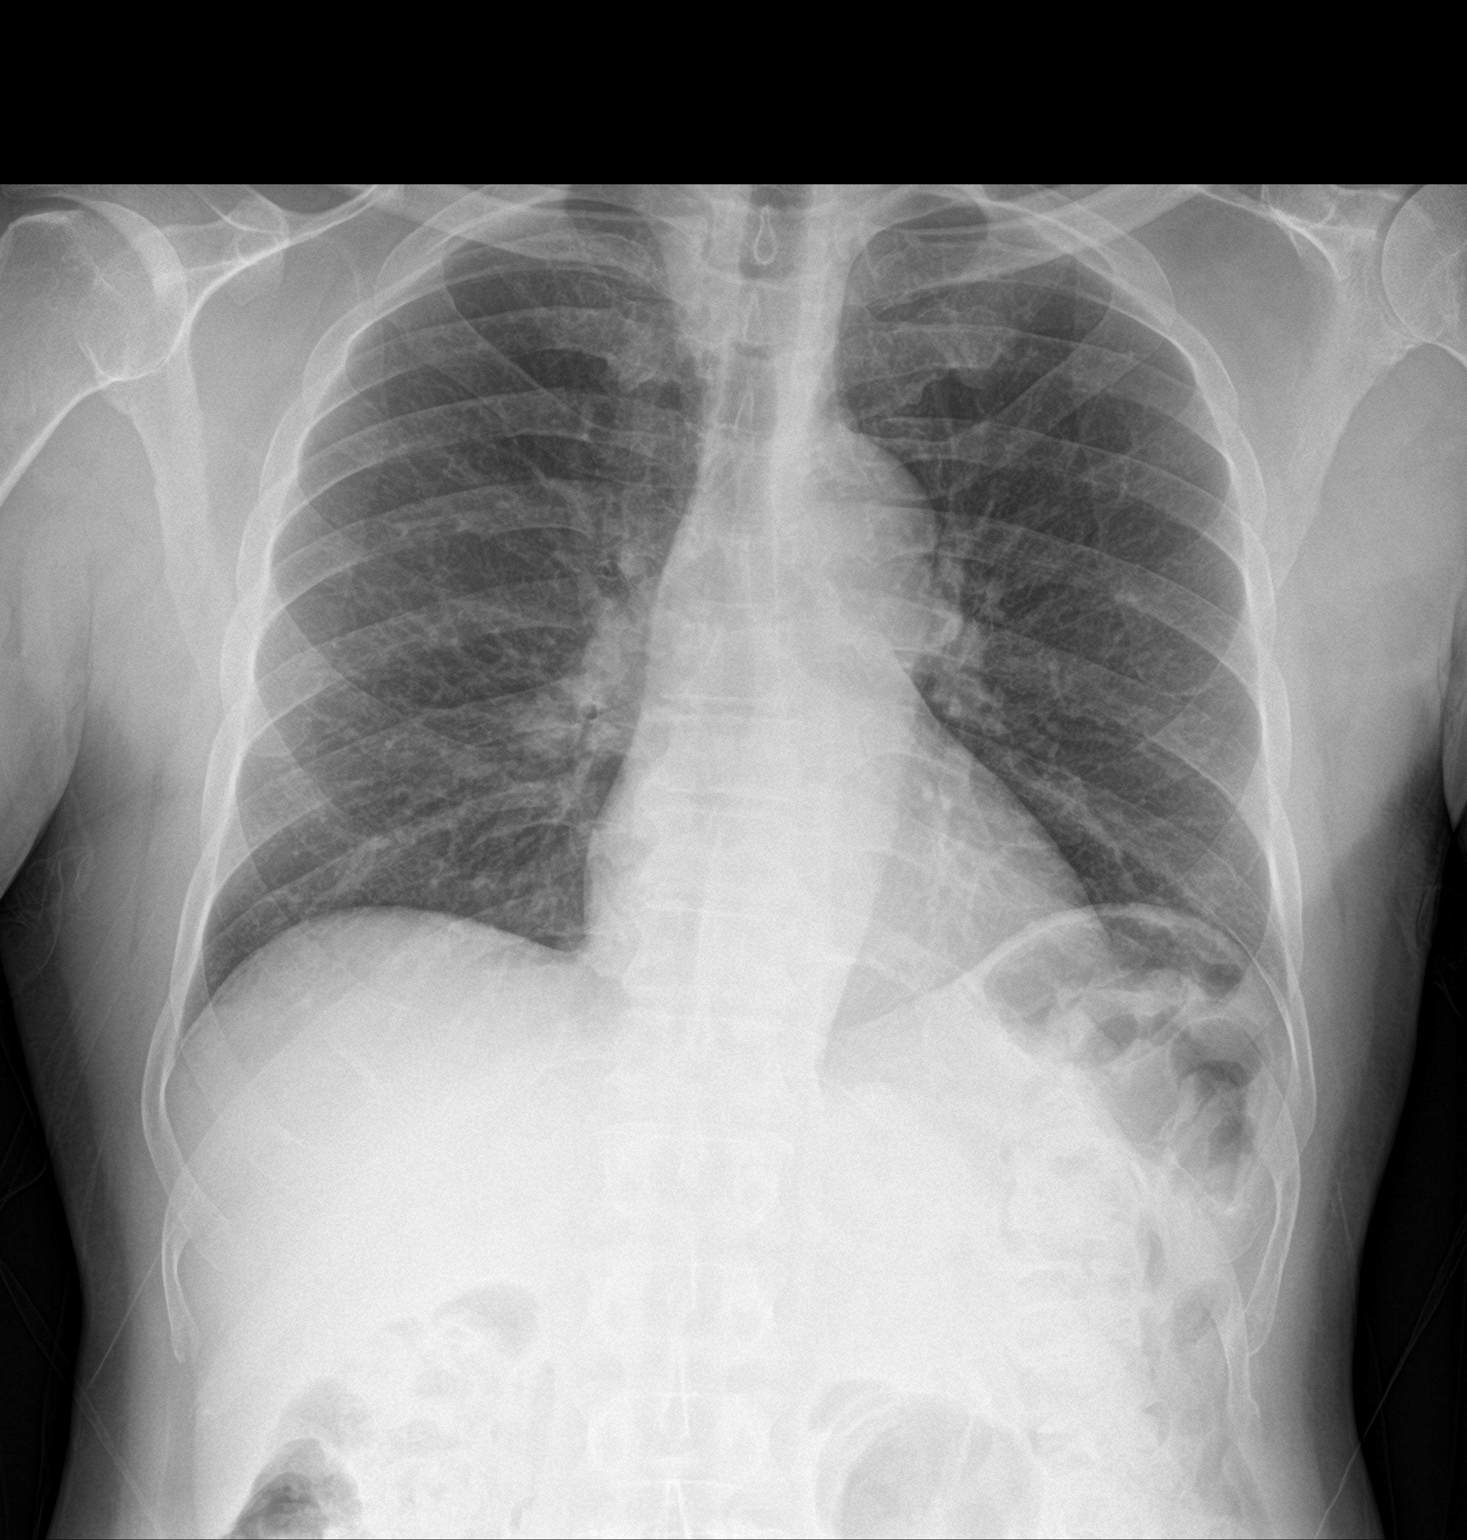

[chest lat]
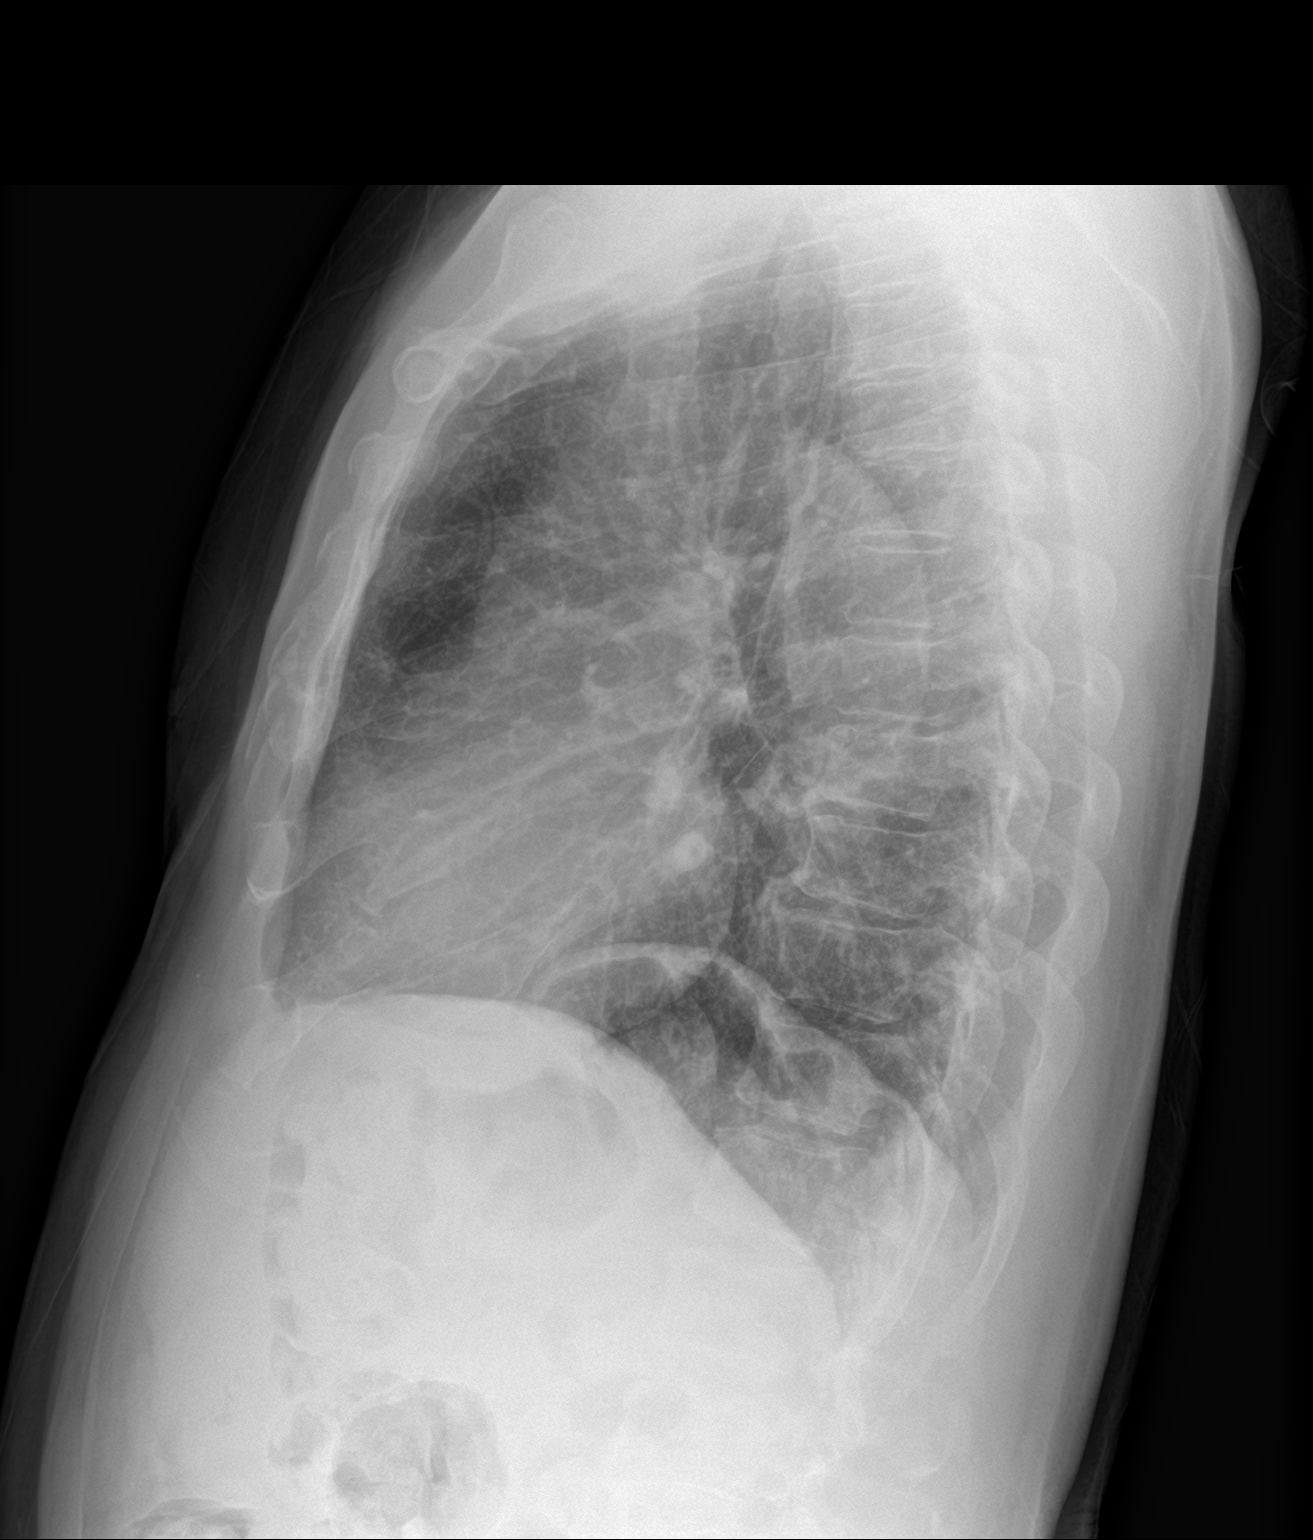

[2 of 2 positions shown; findings below may reference images not displayed]

FINDINGS: There is a possible new opacity projecting over the sixth rib
posteriorly on the left. The heart size is stable. There is no
pneumothorax. No large pleural effusion. There is no definite acute
osseous abnormality.
IMPRESSION: 1. No definite acute cardiopulmonary process.
2. New opacity projecting over the sixth rib posteriorly on the
left. A 4-6 week follow-up two-view chest x-ray is recommended to
confirm resolution or stability of this finding.
